# Patient Record
Sex: Female | Born: 1935 | Race: White | Hispanic: No | Marital: Married
Health system: Southern US, Community
[De-identification: ages and names within clinical notes are randomized; demographics above are authoritative.]

---

## 2013-11-20 ENCOUNTER — Other Ambulatory Visit: Payer: Self-pay | Admitting: Internal Medicine

## 2013-11-20 DIAGNOSIS — R9389 Abnormal findings on diagnostic imaging of other specified body structures: Secondary | ICD-10-CM

## 2013-12-02 ENCOUNTER — Ambulatory Visit
Admission: RE | Admit: 2013-12-02 | Discharge: 2013-12-02 | Disposition: A | Discharge status: Home / Self Care | Payer: Medicare Other | Source: Ambulatory Visit | Attending: Internal Medicine | Admitting: Internal Medicine

## 2013-12-02 DIAGNOSIS — R9389 Abnormal findings on diagnostic imaging of other specified body structures: Secondary | ICD-10-CM

## 2013-12-02 MED ORDER — GADOBENATE DIMEGLUMINE 529 MG/ML IV SOLN
14.0000 mL | Freq: Once | INTRAVENOUS | Status: AC | PRN
  Administered 2013-12-02: 14 mL via INTRAVENOUS

## 2014-08-06 ENCOUNTER — Other Ambulatory Visit: Payer: Self-pay

## 2015-10-21 DIAGNOSIS — C799 Secondary malignant neoplasm of unspecified site: Secondary | ICD-10-CM | POA: Diagnosis not present

## 2015-10-21 DIAGNOSIS — C3412 Malignant neoplasm of upper lobe, left bronchus or lung: Secondary | ICD-10-CM | POA: Diagnosis not present

## 2015-11-11 DIAGNOSIS — C3492 Malignant neoplasm of unspecified part of left bronchus or lung: Secondary | ICD-10-CM | POA: Diagnosis not present

## 2015-11-18 DIAGNOSIS — F418 Other specified anxiety disorders: Secondary | ICD-10-CM

## 2015-11-18 DIAGNOSIS — C3490 Malignant neoplasm of unspecified part of unspecified bronchus or lung: Secondary | ICD-10-CM | POA: Diagnosis not present

## 2015-11-18 DIAGNOSIS — E785 Hyperlipidemia, unspecified: Secondary | ICD-10-CM

## 2015-11-18 DIAGNOSIS — J449 Chronic obstructive pulmonary disease, unspecified: Secondary | ICD-10-CM

## 2015-11-18 DIAGNOSIS — I10 Essential (primary) hypertension: Secondary | ICD-10-CM | POA: Diagnosis not present

## 2015-11-18 DIAGNOSIS — E441 Mild protein-calorie malnutrition: Secondary | ICD-10-CM

## 2015-11-18 DIAGNOSIS — J9621 Acute and chronic respiratory failure with hypoxia: Secondary | ICD-10-CM | POA: Diagnosis not present

## 2015-11-19 DIAGNOSIS — J449 Chronic obstructive pulmonary disease, unspecified: Secondary | ICD-10-CM

## 2015-11-19 DIAGNOSIS — C3490 Malignant neoplasm of unspecified part of unspecified bronchus or lung: Secondary | ICD-10-CM

## 2015-11-19 DIAGNOSIS — I10 Essential (primary) hypertension: Secondary | ICD-10-CM

## 2015-11-19 DIAGNOSIS — E441 Mild protein-calorie malnutrition: Secondary | ICD-10-CM

## 2015-11-19 DIAGNOSIS — E785 Hyperlipidemia, unspecified: Secondary | ICD-10-CM

## 2015-11-19 DIAGNOSIS — Z515 Encounter for palliative care: Secondary | ICD-10-CM | POA: Diagnosis not present

## 2015-11-19 DIAGNOSIS — J9621 Acute and chronic respiratory failure with hypoxia: Secondary | ICD-10-CM

## 2015-11-19 DIAGNOSIS — F418 Other specified anxiety disorders: Secondary | ICD-10-CM

## 2015-11-20 DIAGNOSIS — J449 Chronic obstructive pulmonary disease, unspecified: Secondary | ICD-10-CM | POA: Diagnosis not present

## 2015-11-20 DIAGNOSIS — C3490 Malignant neoplasm of unspecified part of unspecified bronchus or lung: Secondary | ICD-10-CM | POA: Diagnosis not present

## 2015-11-20 DIAGNOSIS — Z515 Encounter for palliative care: Secondary | ICD-10-CM | POA: Diagnosis not present

## 2015-11-20 DIAGNOSIS — C3492 Malignant neoplasm of unspecified part of left bronchus or lung: Secondary | ICD-10-CM

## 2015-11-20 DIAGNOSIS — C787 Secondary malignant neoplasm of liver and intrahepatic bile duct: Secondary | ICD-10-CM | POA: Diagnosis not present

## 2015-11-20 DIAGNOSIS — J9621 Acute and chronic respiratory failure with hypoxia: Secondary | ICD-10-CM | POA: Diagnosis not present

## 2015-11-20 DIAGNOSIS — R627 Adult failure to thrive: Secondary | ICD-10-CM | POA: Diagnosis not present

## 2015-11-20 DIAGNOSIS — I10 Essential (primary) hypertension: Secondary | ICD-10-CM | POA: Diagnosis not present

## 2015-11-20 DIAGNOSIS — E441 Mild protein-calorie malnutrition: Secondary | ICD-10-CM | POA: Diagnosis not present

## 2015-11-20 DIAGNOSIS — E785 Hyperlipidemia, unspecified: Secondary | ICD-10-CM | POA: Diagnosis not present

## 2015-11-20 DIAGNOSIS — F418 Other specified anxiety disorders: Secondary | ICD-10-CM | POA: Diagnosis not present

## 2015-11-20 DIAGNOSIS — C772 Secondary and unspecified malignant neoplasm of intra-abdominal lymph nodes: Secondary | ICD-10-CM

## 2015-12-08 DEATH — deceased

## 2016-01-03 IMAGING — MR MR THORACIC SPINE WO/W CM
4 of 8 series · 24 of 48 positions shown · IV contrast (multihance)
Comparison: CT of the abdomen and pelvis at [facility]
11/10/2013. Thoracic spine radiographs 10/03/2013.

CLINICAL DATA: Bladder cancer. Lung cancer. Spine fracture.
Abnormal CT scan. Lytic lesion at the right posterior T12 vertebral
body.

EXAM:
MRI THORACIC SPINE WITHOUT AND WITH CONTRAST
TECHNIQUE: Multiplanar and multiecho pulse sequences of the thoracic spine were
obtained without and with intravenous contrast.
CONTRAST:  14mL MULTIHANCE GADOBENATE DIMEGLUMINE 529 MG/ML IV SOLN

[Series 5: T2 · sagittal · 4.0mm · 0.50mm/px · 3 of 12 slices shown (1 of 2)]
[im 1/12]
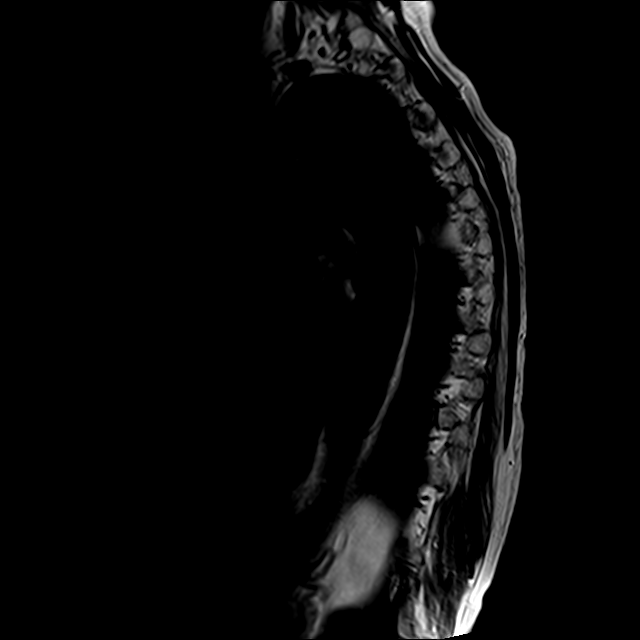
[im 6/12]
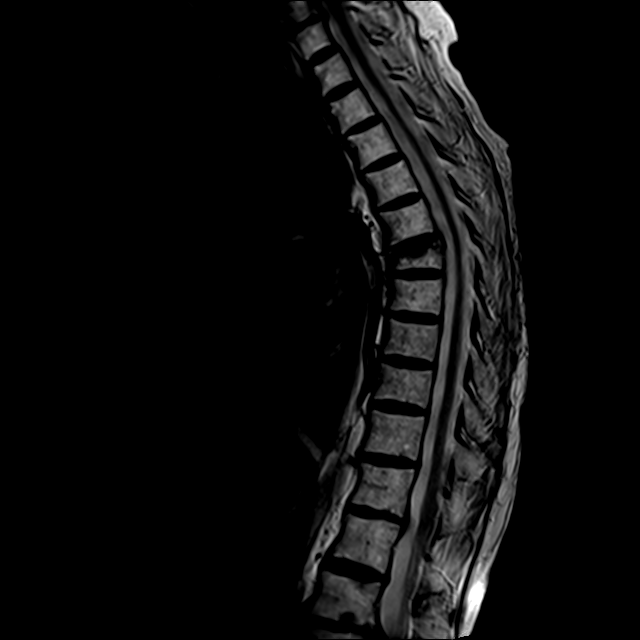
[im 12/12]
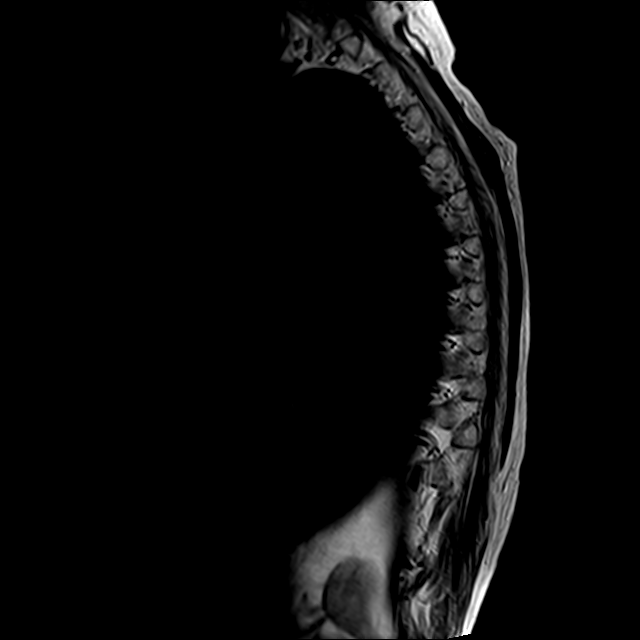

[Series 6: T1 · sagittal · 4.0mm · 0.50mm/px · 3 of 12 slices shown (1 of 2)]
[im 1/12]
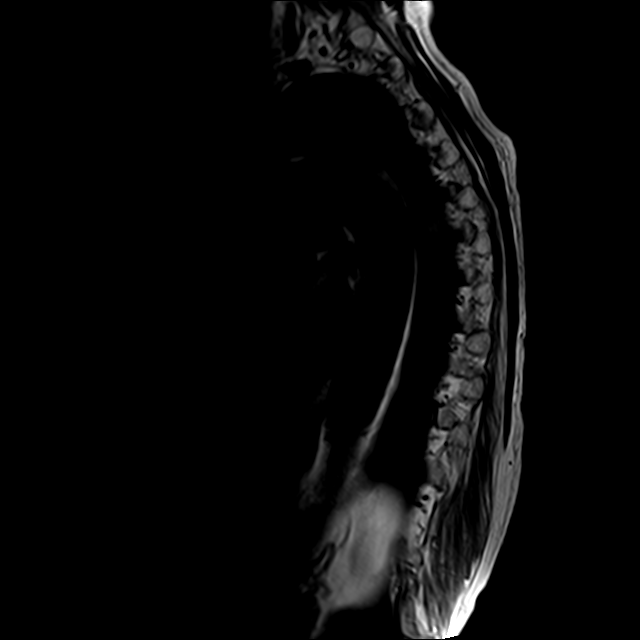
[im 6/12]
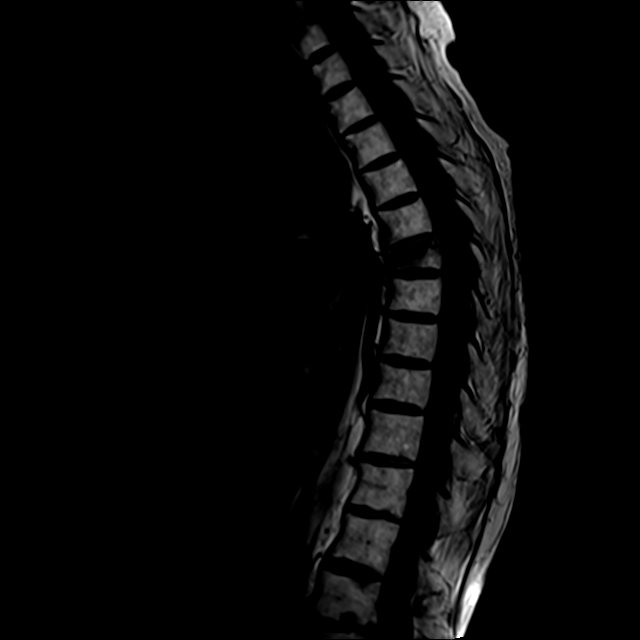
[im 12/12]
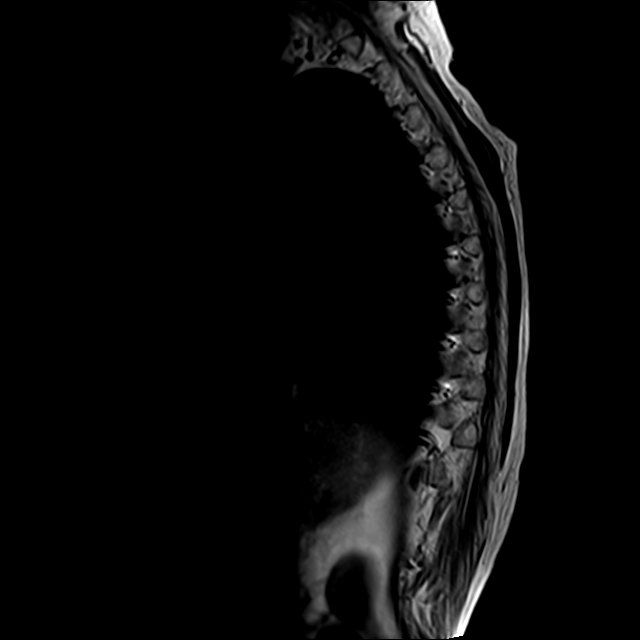

[Series 8: T2 · axial · 3.0mm · 0.42mm/px · z∈[-301,-83]mm · 9 of 30 slices shown (2 of 2)]
[im 1/30]
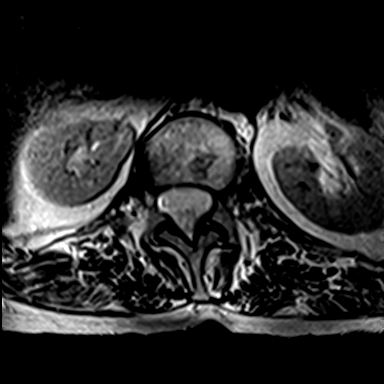
[im 4/30]
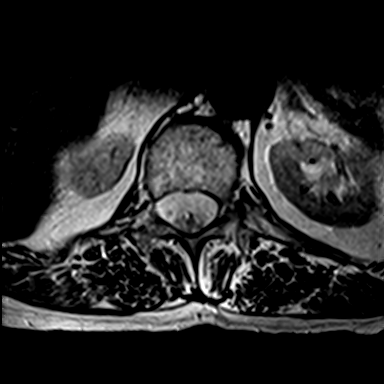
[im 8/30]
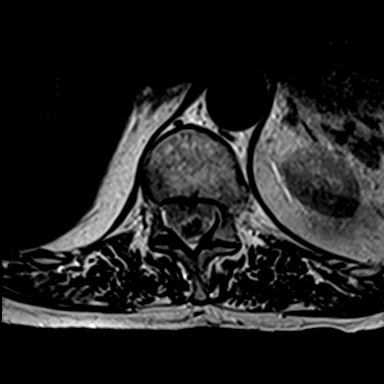
[im 11/30]
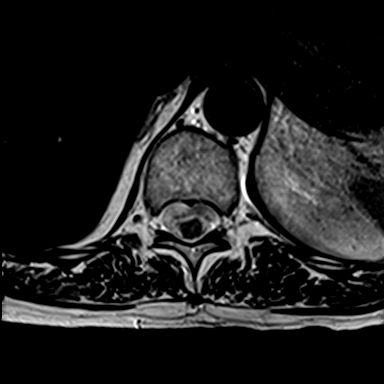
[im 15/30]
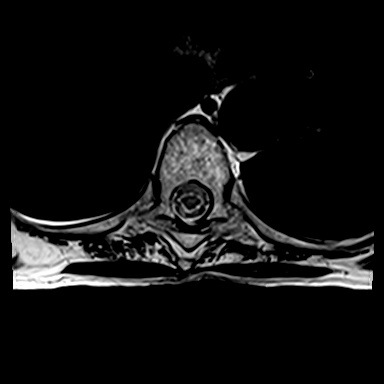
[im 19/30]
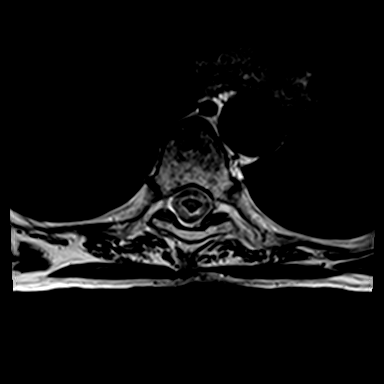
[im 22/30]
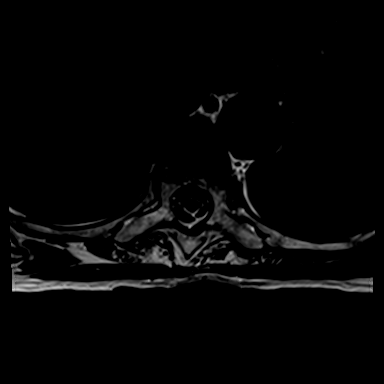
[im 26/30]
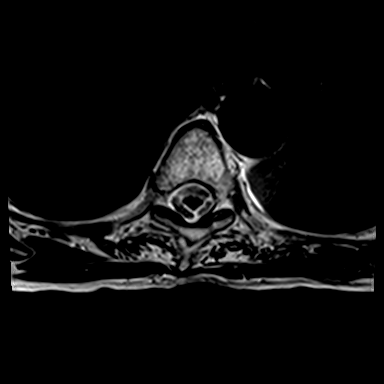
[im 30/30]
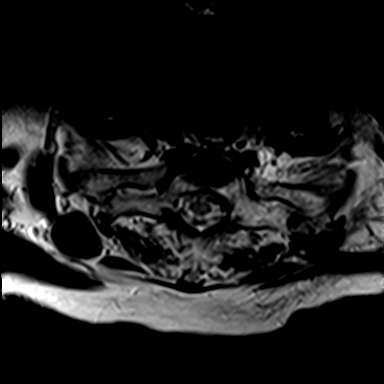

[Series 10: T1 · axial · non-contrast · 3.0mm · 0.42mm/px · z∈[-301,-83]mm · 9 of 30 slices shown (2 of 2)]
[im 1/30]
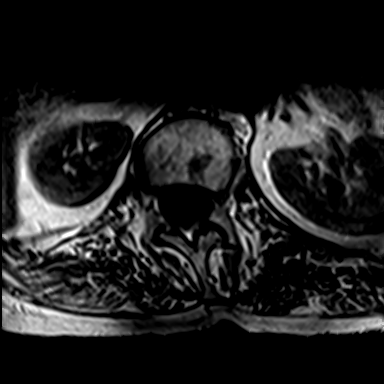
[im 4/30]
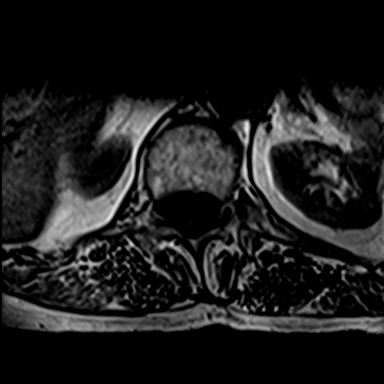
[im 8/30]
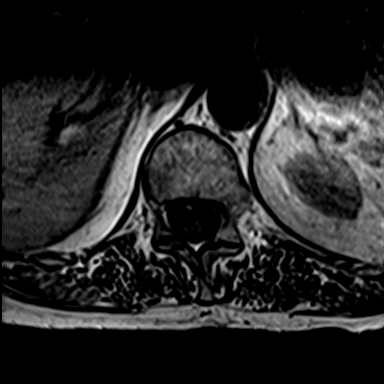
[im 11/30]
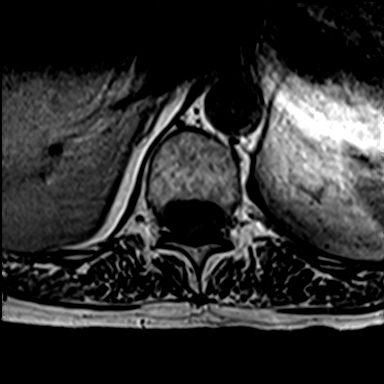
[im 15/30]
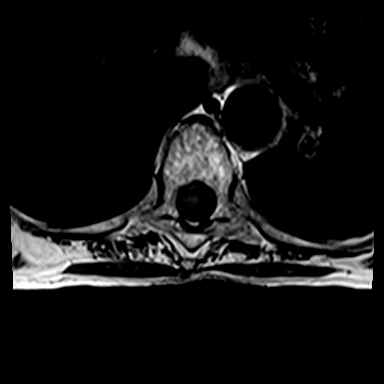
[im 19/30]
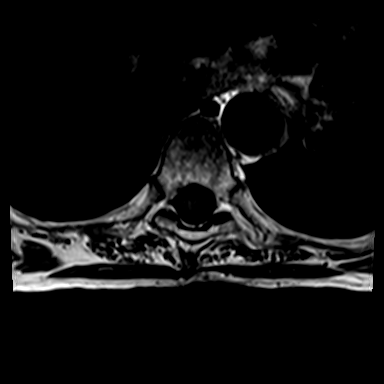
[im 22/30]
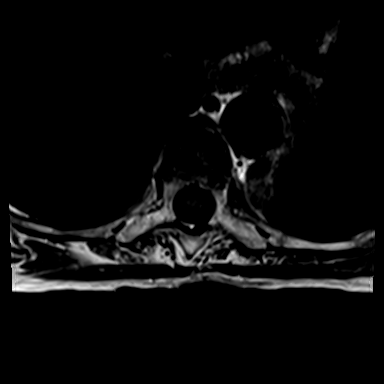
[im 26/30]
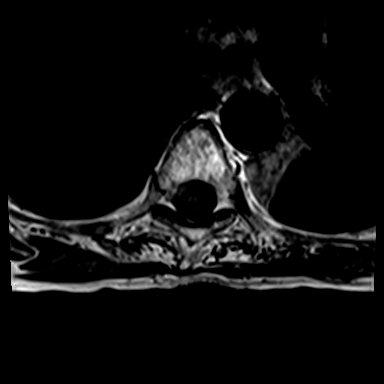
[im 30/30]
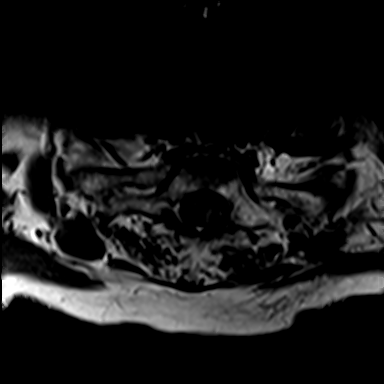

[24 of 48 positions shown; findings below may reference images not displayed]

FINDINGS: Normal signal is present in the cervical and upper thoracic spinal
cord scratch the normal signal is present in the thoracic spinal
cord to the conus medullaris which terminates at T12, within normal
limits. And anterior superior endplate fracture is present T6 with
focal kyphosis in slight degenerative anterolisthesis at T6. There
is minimal signal abnormality and enhancement along the anterior
inferior endplate of T5 suggesting edema without collapse. There
slight indentation on the anterior thecal sac due to the kyphosis.
No significant stenosis is evident.

The right posterior lesion at T12 is a hemangioma with T1 shortening
on the pre and postcontrast images. No other focal marrow lesions
are present. There is no evidence for osseous metastases. The
compression fracture is likely osteoporotic.

A focal central disc protrusion is evident at T11-12 with partial
effacement of the ventral CSF. There is no contact of the cord or
significant central canal stenosis. No other significant disc
protrusions or stenosis are present.
IMPRESSION: 1. The lesion at T12 is a small hemangioma.
2. No evidence for metastatic disease to the spine.
3. Osteoporotic compression fracture involving the superior endplate
of T6 with greater than 50% loss of height. There is slight
associated anterolisthesis at T5-6.
4. Minimal anterior inferior marrow signal abnormality at T5 likely
represents some edema due to the kyphosis without definite loss of
vertebral body height.
